# Patient Record
Sex: Female | Born: 1935 | Race: Black or African American | Hispanic: No | Smoking: Never smoker
Health system: Southern US, Community
[De-identification: ages and names within clinical notes are randomized; demographics above are authoritative.]

## PROBLEM LIST (undated history)

## (undated) DIAGNOSIS — I10 Essential (primary) hypertension: Secondary | ICD-10-CM

## (undated) DIAGNOSIS — E119 Type 2 diabetes mellitus without complications: Secondary | ICD-10-CM

## (undated) DIAGNOSIS — I499 Cardiac arrhythmia, unspecified: Secondary | ICD-10-CM

## (undated) DIAGNOSIS — Q615 Medullary cystic kidney: Secondary | ICD-10-CM

## (undated) DIAGNOSIS — M1990 Unspecified osteoarthritis, unspecified site: Secondary | ICD-10-CM

---

## 2016-01-03 ENCOUNTER — Emergency Department (HOSPITAL_BASED_OUTPATIENT_CLINIC_OR_DEPARTMENT_OTHER): Payer: Medicare Other

## 2016-01-03 ENCOUNTER — Encounter (HOSPITAL_BASED_OUTPATIENT_CLINIC_OR_DEPARTMENT_OTHER): Payer: Self-pay | Admitting: *Deleted

## 2016-01-03 ENCOUNTER — Emergency Department (HOSPITAL_BASED_OUTPATIENT_CLINIC_OR_DEPARTMENT_OTHER)
Admission: EM | Admit: 2016-01-03 | Discharge: 2016-01-03 | Disposition: A | Discharge status: Home / Self Care | Payer: Medicare Other | Attending: Emergency Medicine | Admitting: Emergency Medicine

## 2016-01-03 DIAGNOSIS — Z7984 Long term (current) use of oral hypoglycemic drugs: Secondary | ICD-10-CM | POA: Insufficient documentation

## 2016-01-03 DIAGNOSIS — Z79899 Other long term (current) drug therapy: Secondary | ICD-10-CM | POA: Diagnosis not present

## 2016-01-03 DIAGNOSIS — I10 Essential (primary) hypertension: Secondary | ICD-10-CM

## 2016-01-03 DIAGNOSIS — I1310 Hypertensive heart and chronic kidney disease without heart failure, with stage 1 through stage 4 chronic kidney disease, or unspecified chronic kidney disease: Secondary | ICD-10-CM | POA: Diagnosis not present

## 2016-01-03 DIAGNOSIS — N189 Chronic kidney disease, unspecified: Secondary | ICD-10-CM | POA: Insufficient documentation

## 2016-01-03 DIAGNOSIS — E1122 Type 2 diabetes mellitus with diabetic chronic kidney disease: Secondary | ICD-10-CM | POA: Insufficient documentation

## 2016-01-03 HISTORY — DX: Cardiac arrhythmia, unspecified: I49.9

## 2016-01-03 HISTORY — DX: Medullary cystic kidney: Q61.5

## 2016-01-03 HISTORY — DX: Essential (primary) hypertension: I10

## 2016-01-03 HISTORY — DX: Type 2 diabetes mellitus without complications: E11.9

## 2016-01-03 LAB — CBC WITH DIFFERENTIAL/PLATELET
BASOS ABS: 0 10*3/uL (ref 0.0–0.1)
BASOS PCT: 0 %
EOS PCT: 1 %
Eosinophils Absolute: 0.1 10*3/uL (ref 0.0–0.7)
HEMATOCRIT: 34.5 % — AB (ref 36.0–46.0)
Hemoglobin: 11.1 g/dL — ABNORMAL LOW (ref 12.0–15.0)
Lymphocytes Relative: 31 %
Lymphs Abs: 3 10*3/uL (ref 0.7–4.0)
MCH: 29.6 pg (ref 26.0–34.0)
MCHC: 32.2 g/dL (ref 30.0–36.0)
MCV: 92 fL (ref 78.0–100.0)
MONO ABS: 1.1 10*3/uL — AB (ref 0.1–1.0)
MONOS PCT: 12 %
NEUTROS ABS: 5.6 10*3/uL (ref 1.7–7.7)
Neutrophils Relative %: 56 %
PLATELETS: 192 10*3/uL (ref 150–400)
RBC: 3.75 MIL/uL — ABNORMAL LOW (ref 3.87–5.11)
RDW: 14.8 % (ref 11.5–15.5)
WBC: 9.8 10*3/uL (ref 4.0–10.5)

## 2016-01-03 LAB — URINE MICROSCOPIC-ADD ON

## 2016-01-03 LAB — BASIC METABOLIC PANEL
ANION GAP: 7 (ref 5–15)
BUN: 32 mg/dL — AB (ref 6–20)
CALCIUM: 8.8 mg/dL — AB (ref 8.9–10.3)
CO2: 26 mmol/L (ref 22–32)
CREATININE: 1.24 mg/dL — AB (ref 0.44–1.00)
Chloride: 104 mmol/L (ref 101–111)
GFR calc Af Amer: 46 mL/min — ABNORMAL LOW (ref >60)
GFR, EST NON AFRICAN AMERICAN: 40 mL/min — AB (ref >60)
GLUCOSE: 214 mg/dL — AB (ref 65–99)
Potassium: 4.1 mmol/L (ref 3.5–5.1)
Sodium: 137 mmol/L (ref 135–145)

## 2016-01-03 LAB — URINALYSIS, ROUTINE W REFLEX MICROSCOPIC
BILIRUBIN URINE: NEGATIVE
GLUCOSE, UA: 100 mg/dL — AB
Hgb urine dipstick: NEGATIVE
KETONES UR: NEGATIVE mg/dL
NITRITE: NEGATIVE
PH: 5.5 (ref 5.0–8.0)
PROTEIN: NEGATIVE mg/dL
Specific Gravity, Urine: 1.019 (ref 1.005–1.030)

## 2016-01-03 LAB — TROPONIN I: Troponin I: <0.03 ng/mL (ref <0.03)

## 2016-01-03 NOTE — ED Triage Notes (Signed)
Pt reports her b/p has been elevated for 3 days. Recently started on benadryl and prednisone for itching. Pt reports she has been taking her home medications

## 2016-01-03 NOTE — ED Provider Notes (Signed)
[facility] Note   CSN: [id number] Arrival date & time: 01/03/16  1829 By signing my name below, I, Levon Hedger, attest that this documentation has been prepared under the direction and in the presence of No att. providers found . Electronically Signed: Levon Hedger, Scribe. 01/03/2016. 7:38 PM.   History   Chief Complaint Chief Complaint  Patient presents with  . Hypertension   HPI Bryer Gottsch is a 82 y.o. female with hx of DM, rheumatoid arthritis, CKD stage 3,  and HTN who presents to the Emergency Department complaining of elevated blood pressure onset three days ago. Her blood pressure was running high at the doctor's office three days ago and then the next day at another appointment, her BP was in the 200's. Pt has not been consistent with taking her blood pressure medication, but she has been complaint with her medication for the last three days. Pt denies any pain, dizziness, abdominal pain, chest pain, nausea, vomiting, or SOB.   Pt also complains of two episodes of brief chest pain this week which have since resolved. The episodes occur while she is at rest and last for a few seconds. One year ago, pt was prescribed nitroglycerin for this which she has taken with relief. She notes associated lightheadedness and tingling in her hands.  Per pt, this feels like prior episodes of CP. She denies any SOB, nausea or diaphoresis. She denies any escalation of chest pain frequency. No CP with exertion.    Sh has no hx of MI or cardiac stents. She is followed by Dr. Judithe Modest for cardiology and Joetta Manners at [facility] for primary care. Pt is a nonsmoker.   The history is provided by the patient. No language interpreter was used.   Past Medical History:  Diagnosis Date  . Diabetes mellitus without complication (HCC)   . Hypertension   . Irregular heart beat   . Sponge kidney     There are no active problems to display for this patient.  History reviewed.  No pertinent surgical history.  OB History    No data available      Home Medications    Prior to Admission medications   Medication Sig Start Date End Date Taking? Authorizing Provider  ATORVASTATIN CALCIUM PO Take by mouth.   Yes Historical Provider, MD  CARVEDILOL PO Take by mouth.   Yes Historical Provider, MD  GABAPENTIN PO Take by mouth as needed.   Yes Historical Provider, MD  LISINOPRIL PO Take by mouth.   Yes Historical Provider, MD  METFORMIN HCL PO Take by mouth.   Yes Historical Provider, MD  methotrexate (RHEUMATREX) 2.5 MG tablet Take 2.5 mg by mouth once a week. Caution:Chemotherapy. Protect from light.   Yes Historical Provider, MD   Family History No family history on file.  Social History Social History  Substance Use Topics  . Smoking status: Never Smoker  . Smokeless tobacco: Never Used  . Alcohol use No    Allergies   Review of patient's allergies indicates no known allergies.  Review of Systems Review of Systems 10 systems reviewed and all are negative for acute change except as noted in the HPI.   Physical Exam Updated Vital Signs BP 175/83   Pulse 66   Temp 98 F (36.7 C) (Oral)   Resp 18   Ht 5\' 3"  (1.6 m)   Wt 139 lb (63 kg)   SpO2 97%   BMI 24.62 kg/m   Physical Exam  Constitutional:  She is oriented to person, place, and time. She appears well-developed and well-nourished. No distress.  HENT:  Head: Normocephalic and atraumatic.  Moist mucous membranes  Eyes: Conjunctivae are normal. Pupils are equal, round, and reactive to light.  Neck: Neck supple.  Cardiovascular: Normal rate, regular rhythm and normal heart sounds.   No murmur heard. Pulmonary/Chest: Effort normal and breath sounds normal.  Abdominal: Soft. Bowel sounds are normal. She exhibits no distension. There is no tenderness.  Musculoskeletal: She exhibits no edema.  Neurological: She is alert and oriented to person, place, and time.  Fluent speech  Skin: Skin is  warm and dry.  Psychiatric: She has a normal mood and affect. Judgment normal.  Nursing note and vitals reviewed.  ED Treatments / Results  DIAGNOSTIC STUDIES:  Oxygen Saturation is 100% on RA, normal by my interpretation.    COORDINATION OF CARE:  9:58 PM Discussed treatment plan with pt at bedside and pt agreed to plan.  Labs (all labs ordered are listed, but only abnormal results are displayed) Labs Reviewed  BASIC METABOLIC PANEL - Abnormal; Notable for the following:       Result Value   Glucose, Bld 214 (*)    BUN 32 (*)    Creatinine, Ser 1.24 (*)    Calcium 8.8 (*)    GFR calc non Af Amer 40 (*)    GFR calc Af Amer 46 (*)    All other components within normal limits  CBC WITH DIFFERENTIAL/PLATELET - Abnormal; Notable for the following:    RBC 3.75 (*)    Hemoglobin 11.1 (*)    HCT 34.5 (*)    Monocytes Absolute 1.1 (*)    All other components within normal limits  URINALYSIS, ROUTINE W REFLEX MICROSCOPIC (NOT AT [facility]) - Abnormal; Notable for the following:    Glucose, UA 100 (*)    Leukocytes, UA MODERATE (*)    All other components within normal limits  URINE MICROSCOPIC-ADD ON - Abnormal; Notable for the following:    Squamous Epithelial / LPF 0-5 (*)    Bacteria, UA RARE (*)    All other components within normal limits  URINE CULTURE  TROPONIN I    EKG  EKG Interpretation  Date/Time:  Friday January 03 2016 19:33:56 EDT Ventricular Rate:  64 PR Interval:    QRS Duration: 145 QT Interval:  431 QTC Calculation: 445 R Axis:   -64 Text Interpretation:  Sinus rhythm Atrial premature complexes Consider left atrial enlargement RBBB and LAFB No previous ECGs available Confirmed by Kellyjo Edgren MD, Brieann Osinski [phone] on 01/03/2016 8:18:19 PM       Radiology Dg Chest 2 View  Result Date: 01/03/2016 CLINICAL DATA:  Chest pain a few days ago EXAM: CHEST  2 VIEW COMPARISON:  None. FINDINGS: The heart size and mediastinal contours are within normal limits. There is  aortic atherosclerosis. There is minimal bibasilar atelectasis. There may be a trace left pleural effusion minimal blunting the costophrenic and posterior costophrenic angles. The visualized skeletal structures are unremarkable. IMPRESSION: No active cardiopulmonary disease. Possible trace left pleural effusion. Bibasilar atelectasis. Electronically Signed   By: Tollie Ethavid  Kwon M.D.   On: 01/03/2016 20:20    Procedures Procedures (including critical care time)  Medications Ordered in ED Medications - No data to display   Initial Impression / Assessment and Plan / ED Course  I have reviewed the triage vital signs and the nursing notes.  Pertinent labs & imaging results that were available during my care  of the patient were reviewed by me and considered in my medical decision making (see chart for details).  Clinical Course   Pt Presents with several days of hypertension. She admits to being noncompliant with her medications but did start taking her medicines again recently. She was well-appearing on exam with no complaints. Vital signs notable for hypertension, initially near 200 systolic, later 175/83. During the history the patient mentions to episodes of chest pain lasting a few seconds each, last episode was 2 days ago and she has had none since then. She also notes that she has had these episodes previously and they do not appear to be happening more frequently as of late. Her lab work here shows creatinine 1.2 for which I suspect is close to baseline given her report of chronic kidney disease. I explained that given the brief nature of her episodes and the fact that she has had similar episodes previously, I do not feel she necessarily needs admission for chest pain rule out today as she does follow with a cardiologist and states that she will contact him for a close follow-up appointment. She understands need to return if any further chest pain episodes or new symptoms. I have emphasized the  importance of strict compliance with her blood pressure medications and follow-up with PCP so that she can have appropriate medication adjustments if needed. Patient and daughter voiced understanding of this plan and felt comfortable with discharge. Patient discharged in satisfactory condition.  Final Clinical Impressions(s) / ED Diagnoses   Final diagnoses:  Essential hypertension  Chronic kidney disease, unspecified CKD stage   New Prescriptions Discharge Medication List as of 01/03/2016  9:28 PM    I personally performed the services described in this documentation, which was scribed in my presence. The recorded information has been reviewed and is accurate.    Laurence Spates, MD 01/03/16 2203

## 2016-01-03 NOTE — ED Notes (Signed)
MD is assessing pt at this time and we are not able to access pt to get the EKG done.

## 2016-01-03 NOTE — ED Notes (Signed)
MD at bedside. 

## 2016-01-03 NOTE — ED Notes (Signed)
Patient transported to X-ray 

## 2016-01-05 LAB — URINE CULTURE
CULTURE: NO GROWTH
SPECIAL REQUESTS: NORMAL

## 2016-09-09 ENCOUNTER — Emergency Department (HOSPITAL_BASED_OUTPATIENT_CLINIC_OR_DEPARTMENT_OTHER): Payer: Medicare Other

## 2016-09-09 ENCOUNTER — Encounter (HOSPITAL_BASED_OUTPATIENT_CLINIC_OR_DEPARTMENT_OTHER): Payer: Self-pay | Admitting: *Deleted

## 2016-09-09 ENCOUNTER — Emergency Department (HOSPITAL_BASED_OUTPATIENT_CLINIC_OR_DEPARTMENT_OTHER)
Admission: EM | Admit: 2016-09-09 | Discharge: 2016-09-09 | Disposition: A | Discharge status: Home / Self Care | Payer: Medicare Other | Attending: Emergency Medicine | Admitting: Emergency Medicine

## 2016-09-09 DIAGNOSIS — I10 Essential (primary) hypertension: Secondary | ICD-10-CM | POA: Insufficient documentation

## 2016-09-09 DIAGNOSIS — E119 Type 2 diabetes mellitus without complications: Secondary | ICD-10-CM | POA: Insufficient documentation

## 2016-09-09 DIAGNOSIS — Z7984 Long term (current) use of oral hypoglycemic drugs: Secondary | ICD-10-CM | POA: Diagnosis not present

## 2016-09-09 DIAGNOSIS — J189 Pneumonia, unspecified organism: Secondary | ICD-10-CM | POA: Insufficient documentation

## 2016-09-09 DIAGNOSIS — R42 Dizziness and giddiness: Secondary | ICD-10-CM | POA: Diagnosis present

## 2016-09-09 HISTORY — DX: Unspecified osteoarthritis, unspecified site: M19.90

## 2016-09-09 LAB — URINALYSIS, ROUTINE W REFLEX MICROSCOPIC
BILIRUBIN URINE: NEGATIVE
GLUCOSE, UA: NEGATIVE mg/dL
HGB URINE DIPSTICK: NEGATIVE
Ketones, ur: NEGATIVE mg/dL
Leukocytes, UA: NEGATIVE
Nitrite: NEGATIVE
PH: 7.5 (ref 5.0–8.0)
Protein, ur: NEGATIVE mg/dL
SPECIFIC GRAVITY, URINE: 1.007 (ref 1.005–1.030)

## 2016-09-09 LAB — COMPREHENSIVE METABOLIC PANEL
ALBUMIN: 3.9 g/dL (ref 3.5–5.0)
ALK PHOS: 79 U/L (ref 38–126)
ALT: 14 U/L (ref 14–54)
ANION GAP: 8 (ref 5–15)
AST: 21 U/L (ref 15–41)
BILIRUBIN TOTAL: 0.5 mg/dL (ref 0.3–1.2)
BUN: 18 mg/dL (ref 6–20)
CALCIUM: 9.4 mg/dL (ref 8.9–10.3)
CO2: 27 mmol/L (ref 22–32)
Chloride: 102 mmol/L (ref 101–111)
Creatinine, Ser: 0.94 mg/dL (ref 0.44–1.00)
GFR calc Af Amer: >60 mL/min (ref >60)
GFR calc non Af Amer: 55 mL/min — ABNORMAL LOW (ref >60)
GLUCOSE: 139 mg/dL — AB (ref 65–99)
Potassium: 3.9 mmol/L (ref 3.5–5.1)
SODIUM: 137 mmol/L (ref 135–145)
Total Protein: 7.8 g/dL (ref 6.5–8.1)

## 2016-09-09 LAB — CBC WITH DIFFERENTIAL/PLATELET
Basophils Absolute: 0 10*3/uL (ref 0.0–0.1)
Basophils Relative: 0 %
Eosinophils Absolute: 0.1 10*3/uL (ref 0.0–0.7)
Eosinophils Relative: 2 %
HEMATOCRIT: 34.2 % — AB (ref 36.0–46.0)
Hemoglobin: 11.1 g/dL — ABNORMAL LOW (ref 12.0–15.0)
LYMPHS ABS: 1.2 10*3/uL (ref 0.7–4.0)
Lymphocytes Relative: 21 %
MCH: 28.5 pg (ref 26.0–34.0)
MCHC: 32.5 g/dL (ref 30.0–36.0)
MCV: 87.9 fL (ref 78.0–100.0)
MONO ABS: 0.9 10*3/uL (ref 0.1–1.0)
MONOS PCT: 16 %
NEUTROS ABS: 3.5 10*3/uL (ref 1.7–7.7)
NEUTROS PCT: 61 %
Platelets: 231 10*3/uL (ref 150–400)
RBC: 3.89 MIL/uL (ref 3.87–5.11)
RDW: 15.1 % (ref 11.5–15.5)
WBC: 5.7 10*3/uL (ref 4.0–10.5)

## 2016-09-09 LAB — TROPONIN I

## 2016-09-09 LAB — CBG MONITORING, ED: Glucose-Capillary: 125 mg/dL — ABNORMAL HIGH (ref 65–99)

## 2016-09-09 LAB — LIPASE, BLOOD: Lipase: 25 U/L (ref 11–51)

## 2016-09-09 MED ORDER — LEVOFLOXACIN 750 MG PO TABS: 750.0000 mg | ORAL_TABLET | Freq: Every day | ORAL | 1 refills | Status: AC

## 2016-09-09 MED ORDER — SODIUM CHLORIDE 0.9 % IV BOLUS (SEPSIS)
500.0000 mL | Freq: Once | INTRAVENOUS | Status: AC
  Administered 2016-09-09: 500 mL via INTRAVENOUS

## 2016-09-09 MED ORDER — LEVOFLOXACIN 750 MG PO TABS
750.0000 mg | ORAL_TABLET | Freq: Every day | ORAL | Status: DC
  Administered 2016-09-09: 750 mg via ORAL
  Filled 2016-09-09: qty 1

## 2016-09-09 MED ORDER — SODIUM CHLORIDE 0.9 % IV SOLN
INTRAVENOUS | Status: DC
  Administered 2016-09-09: 21:00:00 via INTRAVENOUS

## 2016-09-09 NOTE — Discharge Instructions (Signed)
Chest x-ray raises some concern for possible early pneumonia behind the heart. Take the antibiotic as directed. Make an appointment to follow-up with your doctor. Would expect some improvement in 2 days. Return for any new or worse symptoms.

## 2016-09-09 NOTE — ED Notes (Signed)
ED Provider at bedside. 

## 2016-09-09 NOTE — ED Provider Notes (Signed)
[facility]   CSN: [id number] Arrival date & time: 09/09/16  1851  By signing my name below, I, Linna Darner, attest that this documentation has been prepared under the direction and in the presence of physician practitioner, Vanetta Mulders, MD. Electronically Signed: Linna Darner, Scribe. 09/09/2016. 8:35 PM.  History   Chief Complaint Chief Complaint  Patient presents with  . Dizziness   The history is provided by the patient and a relative. No language interpreter was used.  Dizziness  Quality:  Head spinning Severity:  Moderate Onset quality:  Sudden Duration:  6 hours Timing: persistent. Progression:  Unchanged Chronicity:  New Relieved by:  Nothing Worsened by:  Nothing Ineffective treatments:  None tried Associated symptoms: nausea and palpitations   Associated symptoms: no chest pain, no diarrhea, no headaches, no shortness of breath and no vomiting   Risk factors: multiple medications     HPI Comments: Julie Strickland is a 83 y.o. female brought in by her daughter who presents to the Emergency Department complaining of persistent dizziness beginning around 2 PM this afternoon. She states her dizziness is not a room-spinning sensation. Patient reports some associated nausea without vomiting. Patient had some epigastric pain when her dizziness initially presented but notes this has now resolved. She is concerned that her dizziness may be related to either her blood glucose or blood pressure and she has been compliant with her DM and HTN medications. Patient felt normal yesterday. She has no prior h/o the same. Patient reports some baseline palpitations as well as bilateral ankle pain related to rheumatoid arthritis without acute changes. She denies fevers, chills, vision changes, cough, rhinorrhea, sore throat, dyspnea, chest pain, diarrhea, back pain, dysuria, hematuria, headaches, rashes, or any other associated symptoms.  Past Medical History:   Diagnosis Date  . Arthritis   . Diabetes mellitus without complication (HCC)   . Hypertension   . Irregular heart beat   . Sponge kidney     There are no active problems to display for this patient.   History reviewed. No pertinent surgical history.  OB History    No data available       Home Medications    Prior to Admission medications   Medication Sig Start Date End Date Taking? Authorizing Provider  ATORVASTATIN CALCIUM PO Take by mouth.   Yes [provider]  CARVEDILOL PO Take by mouth.   Yes [provider]  GABAPENTIN PO Take by mouth as needed.   Yes [provider]  GLIMEPIRIDE PO Take by mouth.   Yes [provider]  LISINOPRIL PO Take by mouth.   Yes [provider]  methotrexate (RHEUMATREX) 2.5 MG tablet Take 2.5 mg by mouth once a week. Caution:Chemotherapy. Protect from light.   Yes [provider]  levofloxacin (LEVAQUIN) 750 MG tablet Take 1 tablet (750 mg total) by mouth daily. 09/09/16   Vanetta Mulders, MD  METFORMIN HCL PO Take by mouth.    [provider]    Family History No family history on file.  Social History Social History  Substance Use Topics  . Smoking status: Never Smoker  . Smokeless tobacco: Never Used  . Alcohol use No     Allergies   Patient has no known allergies.   Review of Systems Review of Systems  Constitutional: Negative for chills and fever.  HENT: Negative for rhinorrhea and sore throat.   Eyes: Negative for visual disturbance.  Respiratory: Negative for cough and shortness of breath.  Cardiovascular: Positive for palpitations. Negative for chest pain and leg swelling.  Gastrointestinal: Positive for abdominal pain (resolved) and nausea. Negative for diarrhea and vomiting.  Genitourinary: Negative for dysuria and hematuria.  Musculoskeletal: Positive for arthralgias. Negative for back pain.  Skin: Negative for rash.  Neurological: Positive for  dizziness. Negative for headaches.  Hematological: Does not bruise/bleed easily.  Psychiatric/Behavioral: Negative for confusion.   Physical Exam Updated Vital Signs BP (!) 168/93   Pulse 70   Temp 98 F (36.7 C) (Oral)   Resp 14   Ht 1.651 m (5\' 5" )   Wt 63 kg (139 lb)   SpO2 99%   BMI 23.13 kg/m   Physical Exam  Constitutional: She is oriented to person, place, and time. She appears well-developed and well-nourished.  HENT:  Head: Normocephalic.  Mouth/Throat: Oropharynx is clear and moist.  Eyes: Conjunctivae and EOM are normal. Pupils are equal, round, and reactive to light. Right eye exhibits no discharge. Left eye exhibits no discharge. No scleral icterus.  Cardiovascular: Normal rate and normal heart sounds.  An irregular rhythm present.  No murmur heard. Pulmonary/Chest: Effort normal and breath sounds normal. No respiratory distress. She has no wheezes. She has no rales.  Abdominal: Soft. Bowel sounds are normal. She exhibits no distension. There is no tenderness.  Musculoskeletal: She exhibits no edema.  Arthritic changes in the hands. No pitting edema.  Neurological: She is alert and oriented to person, place, and time. No cranial nerve deficit. She exhibits normal muscle tone. Coordination normal.  Skin: Skin is warm and dry.  Psychiatric: She has a normal mood and affect.  Nursing note and vitals reviewed.  ED Treatments / Results  Labs (all labs ordered are listed, but only abnormal results are displayed) Labs Reviewed  COMPREHENSIVE METABOLIC PANEL - Abnormal; Notable for the following:       Result Value   Glucose, Bld 139 (*)    GFR calc non Af Amer 55 (*)    All other components within normal limits  CBC WITH DIFFERENTIAL/PLATELET - Abnormal; Notable for the following:    Hemoglobin 11.1 (*)    HCT 34.2 (*)    All other components within normal limits  CBG MONITORING, ED - Abnormal; Notable for the following:    Glucose-Capillary 125 (*)    All other  components within normal limits  LIPASE, BLOOD  URINALYSIS, ROUTINE W REFLEX MICROSCOPIC  TROPONIN I    EKG  EKG Interpretation  Date/Time:  Wednesday September 09 2016 19:14:06 EDT Ventricular Rate:  75 PR Interval:  162 QRS Duration: 150 QT Interval:  434 QTC Calculation: 484 R Axis:   104 Text Interpretation:  Sinus rhythm with frequent Premature ventricular complexes Right bundle branch block Abnormal ECG Confirmed by Vanetta MuldersZackowski, Seif Teichert [phone] on 09/09/2016 8:19:37 PM       Radiology Dg Chest 2 View  Result Date: 09/09/2016 CLINICAL DATA:  Acute onset of dizziness and nausea. Initial encounter. EXAM: CHEST  2 VIEW COMPARISON:  Chest radiograph performed 01/03/2016 FINDINGS: The lungs are well-aerated. Mild retrocardiac opacity may reflect atelectasis or possibly mild pneumonia. There is no evidence of pleural effusion or pneumothorax. The heart is borderline enlarged. No acute osseous abnormalities are seen. IMPRESSION: Mild retrocardiac opacity may reflect atelectasis or possibly mild pneumonia. Borderline cardiomegaly. Electronically Signed   By: Roanna RaiderJeffery  Chang M.D.   On: 09/09/2016 21:38   Ct Head Wo Contrast  Result Date: 09/09/2016 CLINICAL DATA:  Nausea and dizziness today. EXAM: CT HEAD WITHOUT  CONTRAST TECHNIQUE: Contiguous axial images were obtained from the base of the skull through the vertex without intravenous contrast. COMPARISON:  05/12/2012. FINDINGS: Brain: There is no evidence for acute hemorrhage, hydrocephalus, mass lesion, or abnormal extra-axial fluid collection. No definite CT evidence for acute infarction. Diffuse loss of parenchymal volume is consistent with atrophy. Patchy low attenuation in the deep hemispheric and periventricular white matter is nonspecific, but likely reflects chronic microvascular ischemic demyelination. Lacunar infarct noted left brainstem, present since prior study. Vascular: No hyperdense vessel or unexpected calcification. Skull: No evidence  for fracture. No worrisome lytic or sclerotic lesion. Sinuses/Orbits: The visualized paranasal sinuses and mastoid air cells are clear. Visualized portions of the globes and intraorbital fat are unremarkable. Other: Choose 1 IMPRESSION: 1. No acute intracranial abnormality. 2. Atrophy with chronic small vessel white matter ischemic disease. Electronically Signed   By: Kennith Center M.D.   On: 09/09/2016 21:29    Procedures Procedures (including critical care time)  DIAGNOSTIC STUDIES: Oxygen Saturation is 100% on RA, normal by my interpretation.    COORDINATION OF CARE: 8:34 PM Discussed treatment plan with pt at bedside and pt agreed to plan.  Medications Ordered in ED Medications  0.9 %  sodium chloride infusion ( Intravenous New Bag/Given 09/09/16 2049)  levofloxacin (LEVAQUIN) tablet 750 mg (not administered)  sodium chloride 0.9 % bolus 500 mL (0 mLs Intravenous Stopped 09/09/16 2125)     Initial Impression / Assessment and Plan / ED Course  I have reviewed the triage vital signs and the nursing notes.  Pertinent labs & imaging results that were available during my care of the patient were reviewed by me and considered in my medical decision making (see chart for details).     The patient feels better overall. Workup without any acute findings other than some concern for retrocardiac may be early pneumonia. Based on patient's symptoms possibilities him in a fever though and no leukocytosis. However will give a course of Levaquin and have her follow-up with her primary care doctor. Patient historically sounds as if this would be a community-acquired pneumonia but she's not roll accurate with her fax and family members are now gone. So we'll treat with Levaquin which will cover for both.  Final Clinical Impressions(s) / ED Diagnoses   Final diagnoses:  Community acquired pneumonia, unspecified laterality    New Prescriptions New Prescriptions   LEVOFLOXACIN (LEVAQUIN) 750 MG  TABLET    Take 1 tablet (750 mg total) by mouth daily.   I personally performed the services described in this documentation, which was scribed in my presence. The recorded information has been reviewed and is accurate.      Vanetta Mulders, MD 09/09/16 [phone]

## 2016-09-09 NOTE — ED Triage Notes (Signed)
Nausea and dizziness this afternoon. Abdominal pain that comes and goes.

## 2016-09-09 NOTE — ED Notes (Signed)
Urine specimen collected and sent to lab pending an order

## 2017-09-23 IMAGING — CT CT HEAD W/O CM
3 series · 15 of 47 positions shown, 18 images · non-contrast
Comparison: 05/12/2012.

CLINICAL DATA: Nausea and dizziness today.

EXAM:
CT HEAD WITHOUT CONTRAST
TECHNIQUE: Contiguous axial images were obtained from the base of the skull
through the vertex without intravenous contrast.

[Series 2: head wo · axial · 0.41mm/px · z∈[+1056,+1186]mm · 9 of 32 slices shown, 12 images]
[im 3/32  brain]
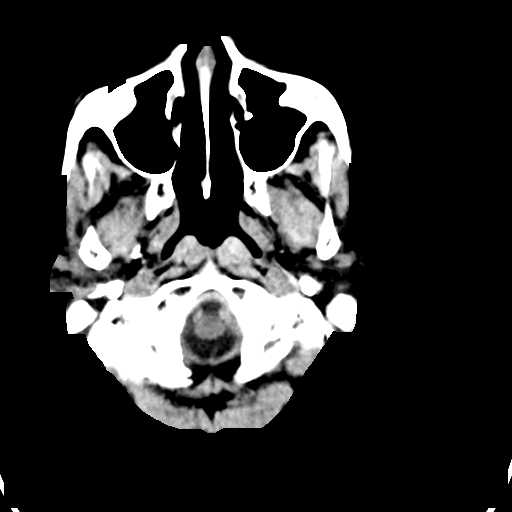
[im 3/32  bone]
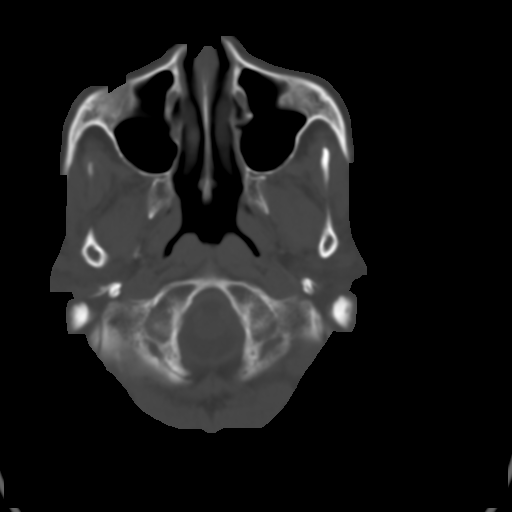
[im 6/32  brain]
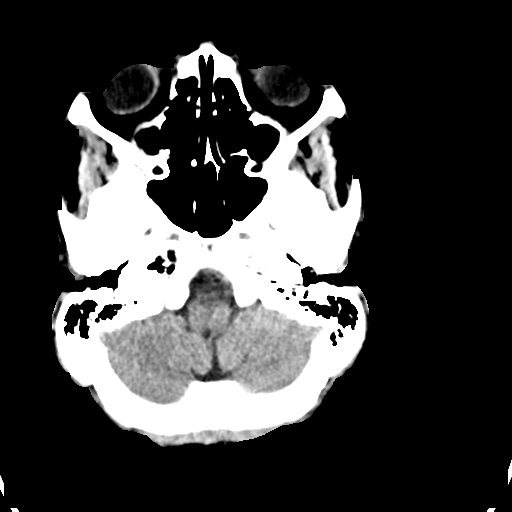
[im 9/32  brain]
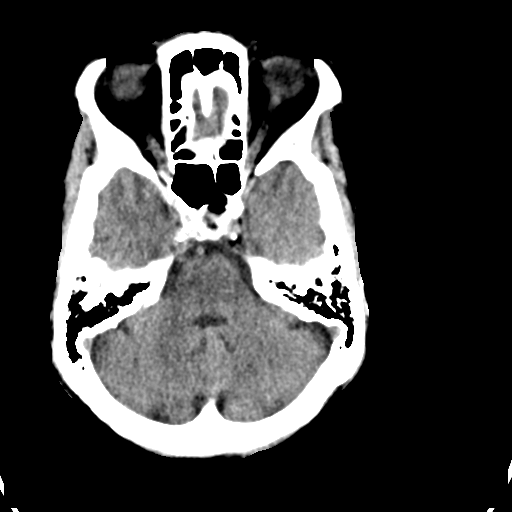
[im 12/32  brain]
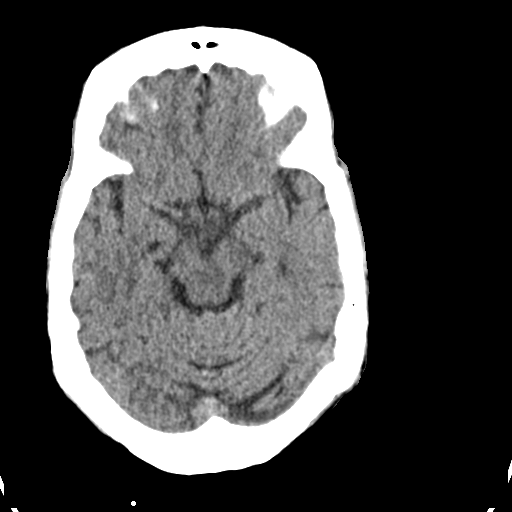
[im 17/32  brain]
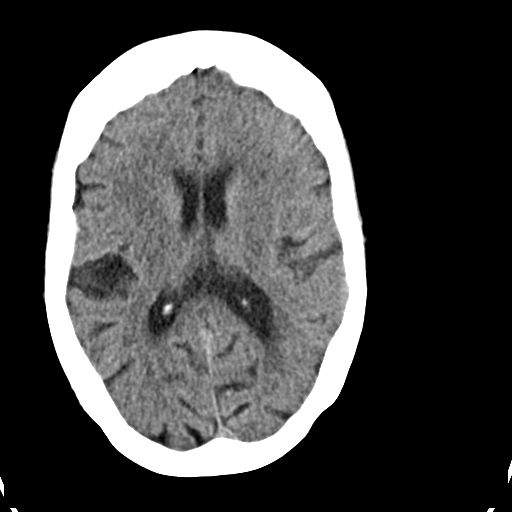
[im 17/32  bone]
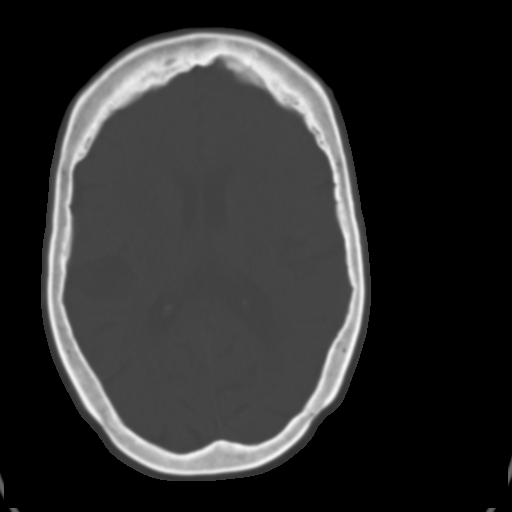
[im 20/32  brain]
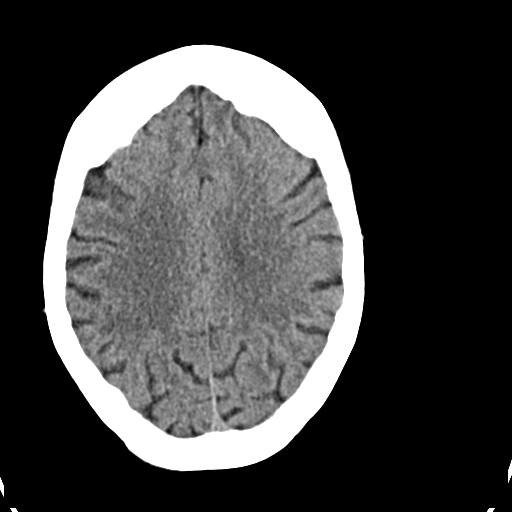
[im 23/32  brain]
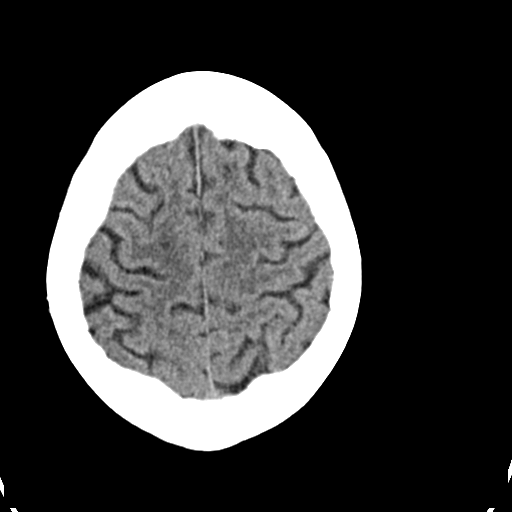
[im 26/32  brain]
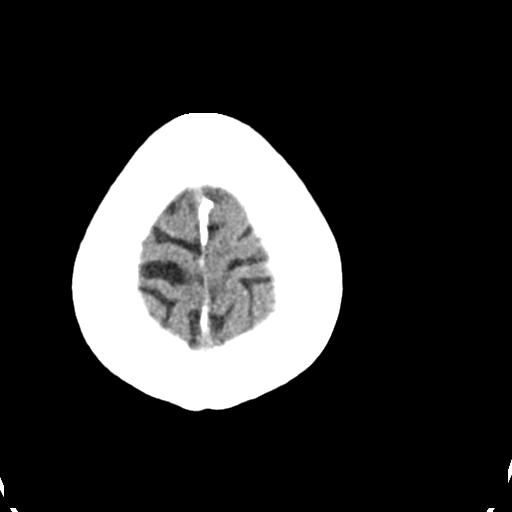
[im 29/32  brain]
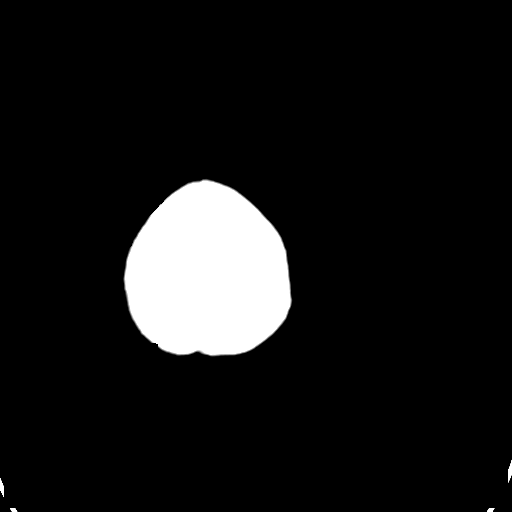
[im 29/32  bone]
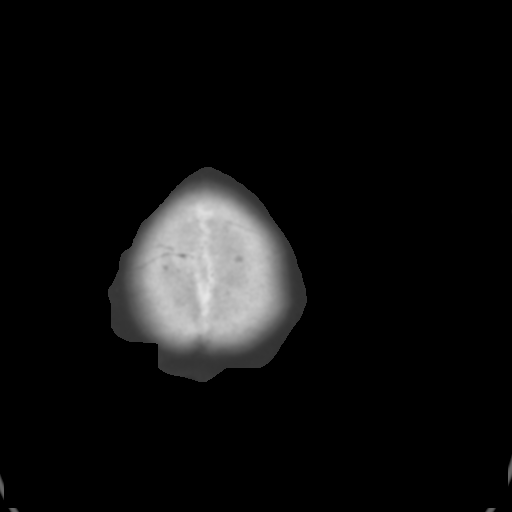

[Series 4: coronal soft · coronal · 0.31mm/px · 3 of 67 slices shown]
[im 23/67  brain]
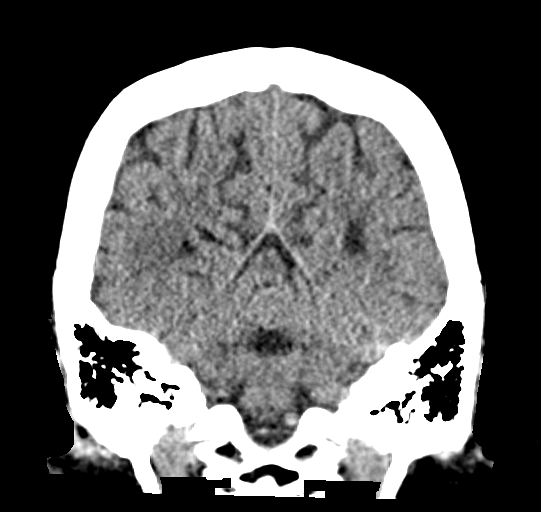
[im 30/67  brain]
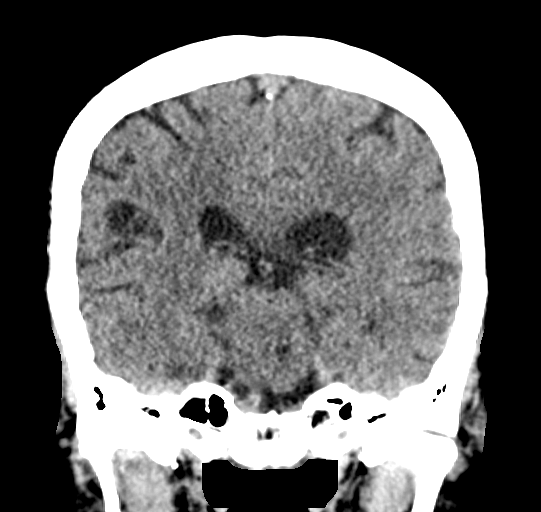
[im 37/67  brain]
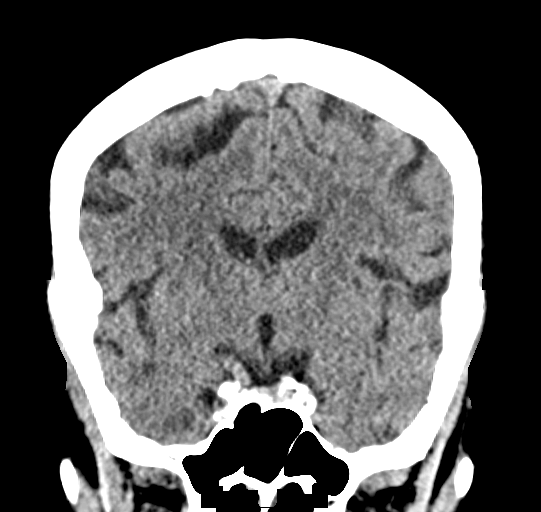

[Series 5: sag soft · sagittal · 0.31mm/px · 3 of 56 slices shown]
[im 19/56  brain]
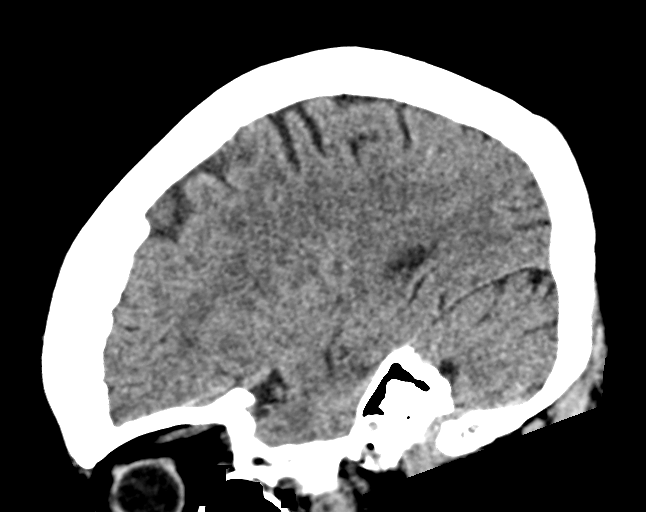
[im 28/56  brain]
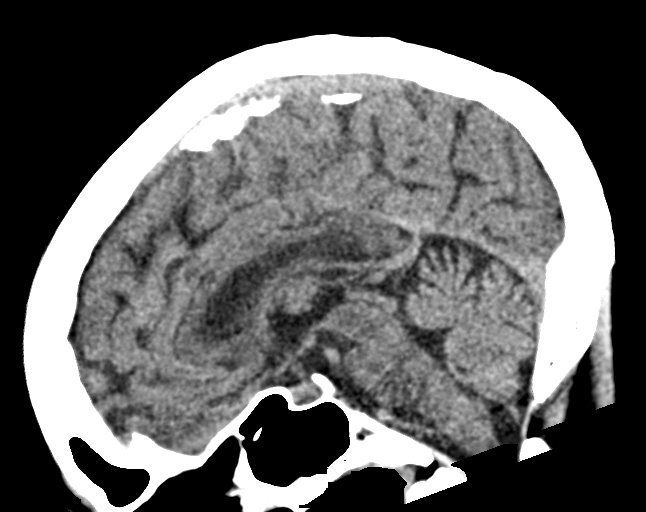
[im 37/56  brain]
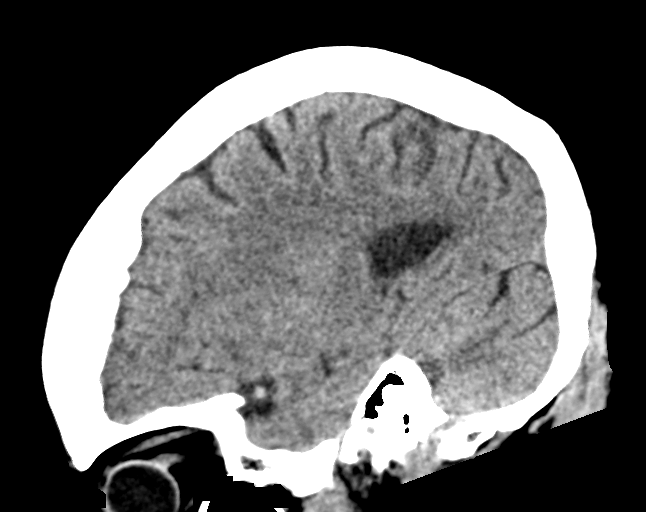

[15 of 47 positions shown; findings below may reference images not displayed]

FINDINGS: Brain: There is no evidence for acute hemorrhage, hydrocephalus,
mass lesion, or abnormal extra-axial fluid collection. No definite
CT evidence for acute infarction. Diffuse loss of parenchymal volume
is consistent with atrophy. Patchy low attenuation in the deep
hemispheric and periventricular white matter is nonspecific, but
likely reflects chronic microvascular ischemic demyelination.
Lacunar infarct noted left brainstem, present since prior study.

Vascular: No hyperdense vessel or unexpected calcification.

Skull: No evidence for fracture. No worrisome lytic or sclerotic
lesion.

Sinuses/Orbits: The visualized paranasal sinuses and mastoid air
cells are clear. Visualized portions of the globes and intraorbital
fat are unremarkable.

Other: Choose 1
IMPRESSION: 1. No acute intracranial abnormality.
2. Atrophy with chronic small vessel white matter ischemic disease.

## 2017-09-23 IMAGING — DX DG CHEST 2V
2 series · 2 of 2 positions shown · non-contrast
Comparison: Chest radiograph performed 01/03/2016

CLINICAL DATA: Acute onset of dizziness and nausea. Initial
encounter.

EXAM:
CHEST  2 VIEW

[chest lat]
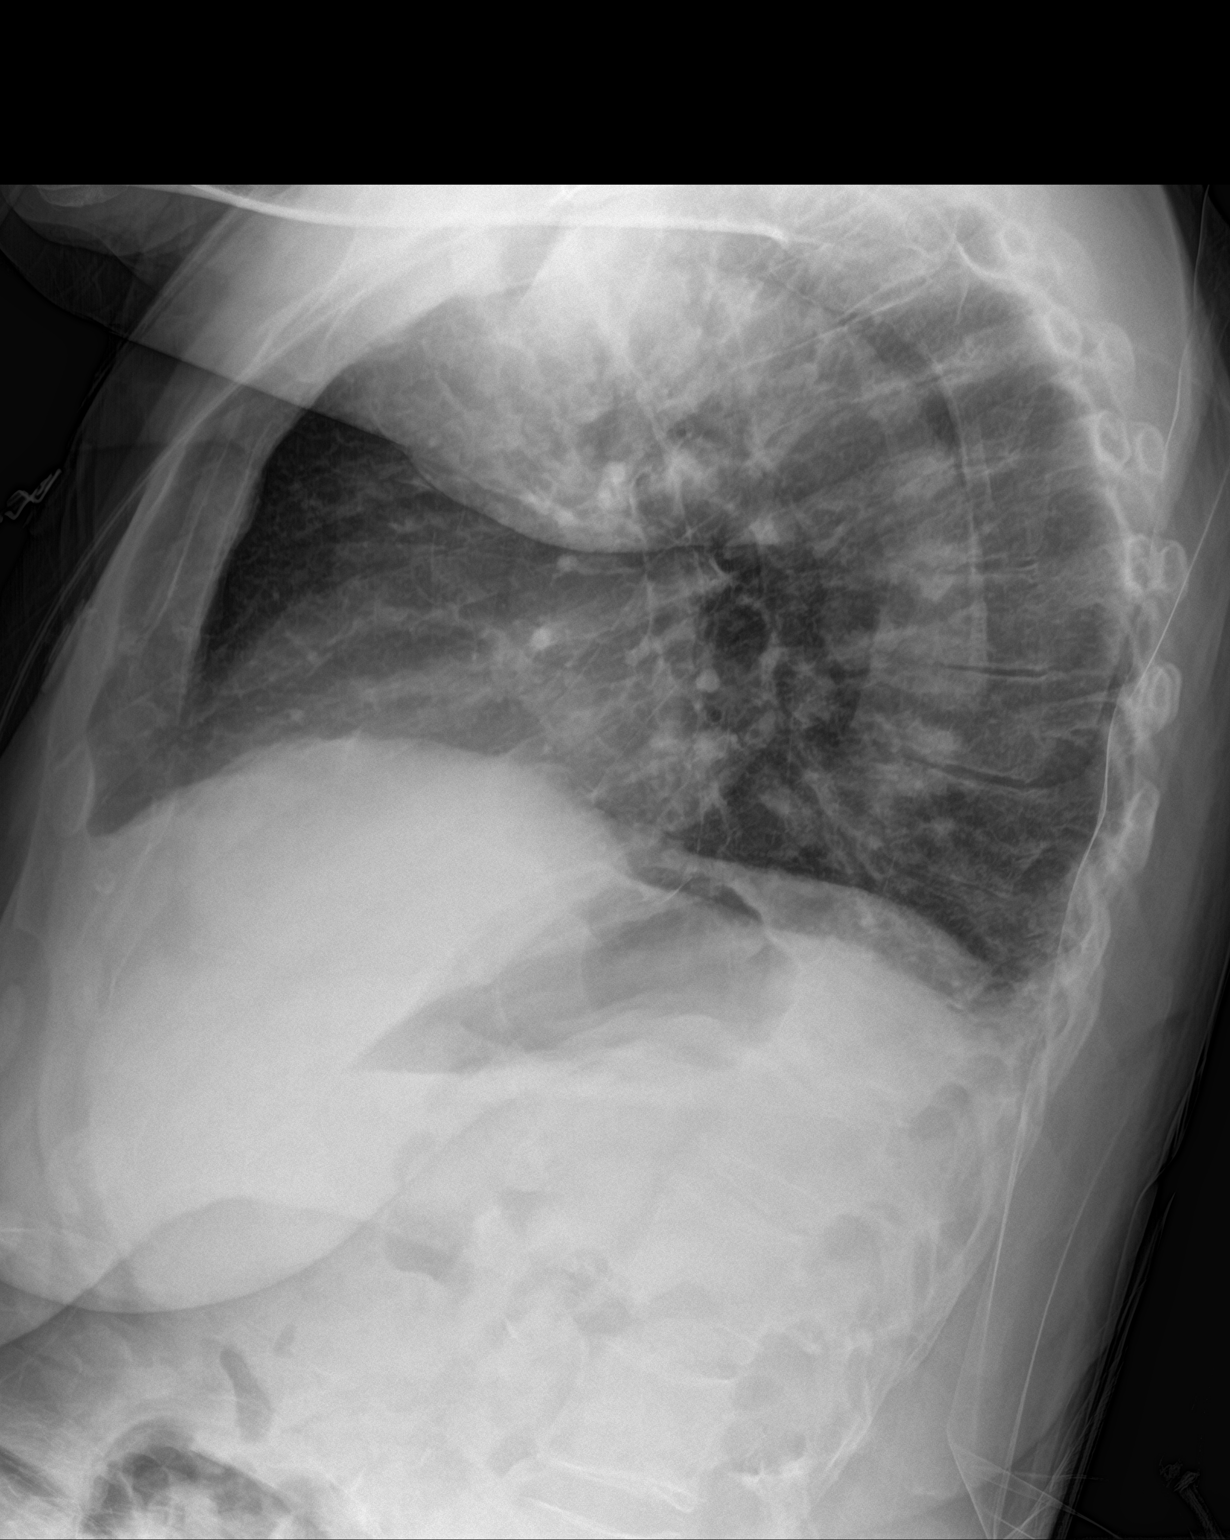

[chest ap strecther]
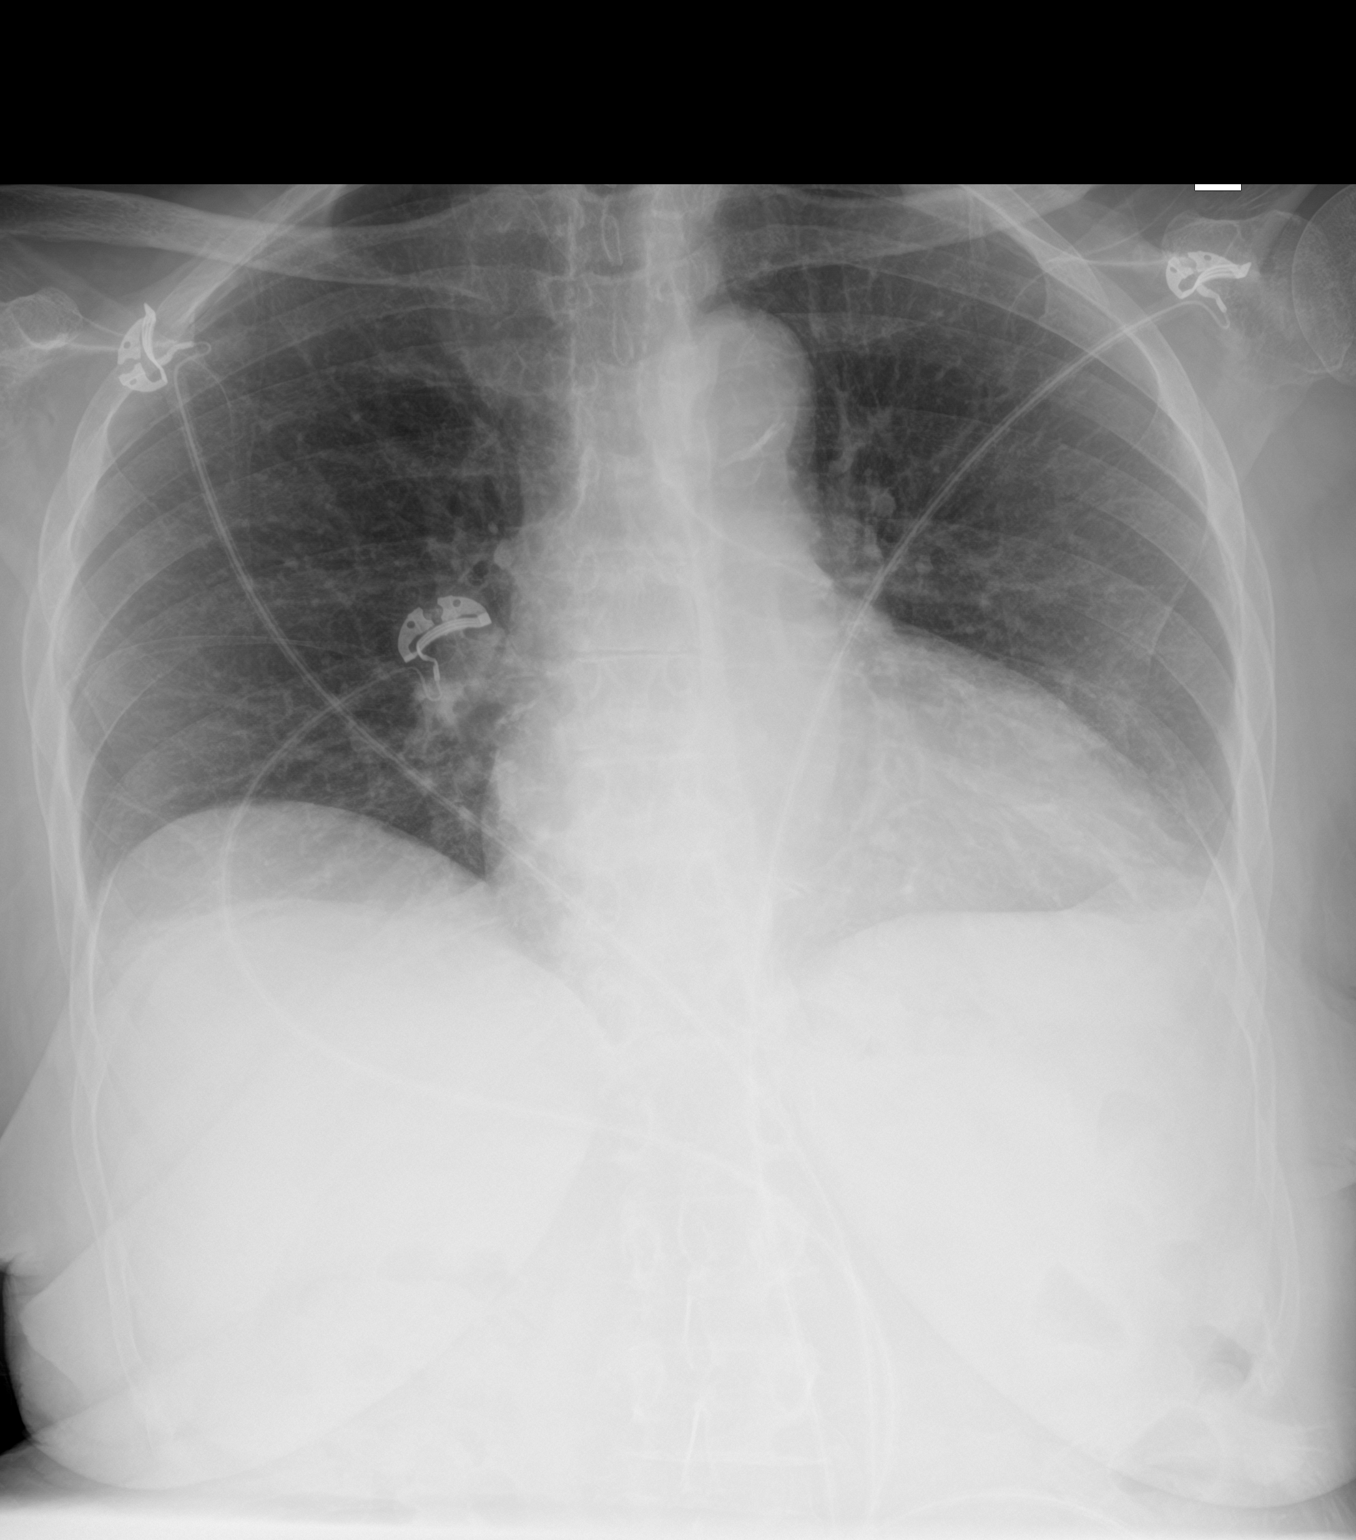

[2 of 2 positions shown; findings below may reference images not displayed]

FINDINGS: The lungs are well-aerated. Mild retrocardiac opacity may reflect
atelectasis or possibly mild pneumonia. There is no evidence of
pleural effusion or pneumothorax.

The heart is borderline enlarged. No acute osseous abnormalities are
seen.
IMPRESSION: Mild retrocardiac opacity may reflect atelectasis or possibly mild
pneumonia. Borderline cardiomegaly.

## 2020-02-29 ENCOUNTER — Encounter (HOSPITAL_BASED_OUTPATIENT_CLINIC_OR_DEPARTMENT_OTHER): Payer: Self-pay | Admitting: Emergency Medicine

## 2020-02-29 ENCOUNTER — Other Ambulatory Visit: Payer: Self-pay

## 2020-02-29 ENCOUNTER — Emergency Department (HOSPITAL_BASED_OUTPATIENT_CLINIC_OR_DEPARTMENT_OTHER)
Admission: EM | Admit: 2020-02-29 | Discharge: 2020-02-29 | Disposition: A | Discharge status: Home / Self Care | Payer: Medicare PPO | Attending: Emergency Medicine | Admitting: Emergency Medicine

## 2020-02-29 ENCOUNTER — Emergency Department (HOSPITAL_BASED_OUTPATIENT_CLINIC_OR_DEPARTMENT_OTHER): Payer: Medicare PPO

## 2020-02-29 DIAGNOSIS — R531 Weakness: Secondary | ICD-10-CM | POA: Diagnosis present

## 2020-02-29 DIAGNOSIS — I10 Essential (primary) hypertension: Secondary | ICD-10-CM | POA: Diagnosis not present

## 2020-02-29 DIAGNOSIS — Z7984 Long term (current) use of oral hypoglycemic drugs: Secondary | ICD-10-CM | POA: Insufficient documentation

## 2020-02-29 DIAGNOSIS — E119 Type 2 diabetes mellitus without complications: Secondary | ICD-10-CM | POA: Insufficient documentation

## 2020-02-29 DIAGNOSIS — Z79899 Other long term (current) drug therapy: Secondary | ICD-10-CM | POA: Diagnosis not present

## 2020-02-29 DIAGNOSIS — U071 COVID-19: Secondary | ICD-10-CM | POA: Insufficient documentation

## 2020-02-29 DIAGNOSIS — N3001 Acute cystitis with hematuria: Secondary | ICD-10-CM | POA: Diagnosis not present

## 2020-02-29 LAB — CBC WITH DIFFERENTIAL/PLATELET
Abs Immature Granulocytes: 0.04 10*3/uL (ref 0.00–0.07)
Basophils Absolute: 0 10*3/uL (ref 0.0–0.1)
Basophils Relative: 0 %
Eosinophils Absolute: 0 10*3/uL (ref 0.0–0.5)
Eosinophils Relative: 0 %
HCT: 32.1 % — ABNORMAL LOW (ref 36.0–46.0)
Hemoglobin: 10.6 g/dL — ABNORMAL LOW (ref 12.0–15.0)
Immature Granulocytes: 0 %
Lymphocytes Relative: 7 %
Lymphs Abs: 0.7 10*3/uL (ref 0.7–4.0)
MCH: 27.7 pg (ref 26.0–34.0)
MCHC: 33 g/dL (ref 30.0–36.0)
MCV: 83.8 fL (ref 80.0–100.0)
Monocytes Absolute: 1.1 10*3/uL — ABNORMAL HIGH (ref 0.1–1.0)
Monocytes Relative: 11 %
Neutro Abs: 8.1 10*3/uL — ABNORMAL HIGH (ref 1.7–7.7)
Neutrophils Relative %: 82 %
Platelets: 210 10*3/uL (ref 150–400)
RBC: 3.83 MIL/uL — ABNORMAL LOW (ref 3.87–5.11)
RDW: 14.5 % (ref 11.5–15.5)
WBC: 10 10*3/uL (ref 4.0–10.5)
nRBC: 0 % (ref 0.0–0.2)

## 2020-02-29 LAB — COMPREHENSIVE METABOLIC PANEL
ALT: 14 U/L (ref 0–44)
AST: 24 U/L (ref 15–41)
Albumin: 3.2 g/dL — ABNORMAL LOW (ref 3.5–5.0)
Alkaline Phosphatase: 57 U/L (ref 38–126)
Anion gap: 11 (ref 5–15)
BUN: 20 mg/dL (ref 8–23)
CO2: 23 mmol/L (ref 22–32)
Calcium: 8.3 mg/dL — ABNORMAL LOW (ref 8.9–10.3)
Chloride: 99 mmol/L (ref 98–111)
Creatinine, Ser: 0.99 mg/dL (ref 0.44–1.00)
GFR, Estimated: 56 mL/min — ABNORMAL LOW (ref >60)
Glucose, Bld: 85 mg/dL (ref 70–99)
Potassium: 3.5 mmol/L (ref 3.5–5.1)
Sodium: 133 mmol/L — ABNORMAL LOW (ref 135–145)
Total Bilirubin: 0.9 mg/dL (ref 0.3–1.2)
Total Protein: 6.9 g/dL (ref 6.5–8.1)

## 2020-02-29 LAB — URINALYSIS, MICROSCOPIC (REFLEX)

## 2020-02-29 LAB — URINALYSIS, ROUTINE W REFLEX MICROSCOPIC
Bilirubin Urine: NEGATIVE
Glucose, UA: NEGATIVE mg/dL
Hgb urine dipstick: NEGATIVE
Ketones, ur: 40 mg/dL — AB
Nitrite: NEGATIVE
Protein, ur: 30 mg/dL — AB
Specific Gravity, Urine: 1.03 (ref 1.005–1.030)
pH: 6 (ref 5.0–8.0)

## 2020-02-29 MED ORDER — FAMOTIDINE IN NACL 20-0.9 MG/50ML-% IV SOLN: 20.0000 mg | Freq: Once | INTRAVENOUS | Status: DC | PRN

## 2020-02-29 MED ORDER — DIPHENHYDRAMINE HCL 50 MG/ML IJ SOLN: 50.0000 mg | Freq: Once | INTRAMUSCULAR | Status: DC | PRN

## 2020-02-29 MED ORDER — CEPHALEXIN 500 MG PO CAPS: 500.0000 mg | ORAL_CAPSULE | Freq: Two times a day (BID) | ORAL | 0 refills | Status: AC

## 2020-02-29 MED ORDER — ALBUTEROL SULFATE HFA 108 (90 BASE) MCG/ACT IN AERS: 2.0000 | INHALATION_SPRAY | Freq: Once | RESPIRATORY_TRACT | Status: DC | PRN

## 2020-02-29 MED ORDER — SODIUM CHLORIDE 0.9 % IV BOLUS
1000.0000 mL | Freq: Once | INTRAVENOUS | Status: AC
  Administered 2020-02-29: 15:00:00 1000 mL via INTRAVENOUS

## 2020-02-29 MED ORDER — SODIUM CHLORIDE 0.9 % IV SOLN
1200.0000 mg | Freq: Once | INTRAVENOUS | Status: AC
  Administered 2020-02-29: 16:00:00 1200 mg via INTRAVENOUS

## 2020-02-29 MED ORDER — EPINEPHRINE 0.3 MG/0.3ML IJ SOAJ: 0.3000 mg | Freq: Once | INTRAMUSCULAR | Status: DC | PRN

## 2020-02-29 MED ORDER — METHYLPREDNISOLONE SODIUM SUCC 125 MG IJ SOLR: 125.0000 mg | Freq: Once | INTRAMUSCULAR | Status: DC | PRN

## 2020-02-29 NOTE — Discharge Instructions (Signed)
You were evaluated in the emergency department today for your weakness.  You have a known COVID-19 infection.  Your physical exam and vital signs were very reassuring.  Your blood work, EKG, chest x-ray were also reassuring.   Your urine did show some signs of infection. You have been prescribed an antibiotic called cephalexin which you should take for the entirety of the prescription.   Additionally in the emergency department you were administered monoclonal antibody therapy, which can help prevent you from developing complications from your COVID-19 infection.  You tolerated them well.  Additionally administered IV fluids as you are mildly dehydrated.  You have been able to walk and interact much more comfortably since administration of fluids.   We feel you are safe to go home at this time, as you have family who will be staying with you.  Please follow-up with your primary care doctor next week for recheck of your urine and reevaluation of your weakness.  Please return to the emergency department develop any worsening weakness, chest pain, shortness of breath, palpitations, if you pass out, or if you develop any other new severe symptoms.

## 2020-02-29 NOTE — ED Notes (Signed)
Patient ambulated in room and she was very dizzy upon standing. No increased HR or decrease in SAT, but wobbly on feet. RN and PA aware

## 2020-02-29 NOTE — ED Notes (Signed)
Pt sonPOA Julie Strickland would like to be called. [phone]

## 2020-02-29 NOTE — ED Notes (Signed)
Son updated per pt request.  Awaiting results.

## 2020-02-29 NOTE — ED Triage Notes (Signed)
Diagnosed with covid last week. C/o gen weakness and fatigue. Denies SOB

## 2020-02-29 NOTE — ED Provider Notes (Signed)
[facility] EMERGENCY DEPARTMENT Provider Note   CSN: [id number] Arrival date & time: 02/29/20  0944     History Chief Complaint  Patient presents with  . Weakness    COVID+    Julie Strickland is a 86 y.o. female who presents with concern for extreme weakness and fatigue.  Patient with vaccine against COVID-19, however did not get her booster vaccination.  She began to experience myalgias on 12/15, presented to PCP on 12/16 and tested positive for COVID-19 at that time.  Patient endorses progressively worsening fatigue since then, states that this morning she woke up with severe weakness, "I could not even get myself out of bed, and I can usually do for myself just fine".  Patient denies chest pain, shortness of breath, palpitations, abdominal pain, nausea, vomiting, diarrhea, loss of taste or smell.  She denies dysuria but endorses urinary frequency, denies urgency or hematuria.    Collateral history obtained from her son, Julie Strickland, who endorses that his mother is normally ambulatory without issue, able to care for herself in her home.  States she has been getting progressively weaker over the past week, however has never had weakness like she experienced today.  I personally reviewed patient medical record of history of hypertension, diabetes mellitus, arrhythmia, sponge kidney, arthritis.  HPI     Past Medical History:  Diagnosis Date  . Arthritis   . Diabetes mellitus without complication (HCC)   . Hypertension   . Irregular heart beat   . Sponge kidney     There are no problems to display for this patient.   History reviewed. No pertinent surgical history.   OB History   No obstetric history on file.     No family history on file.  Social History   Tobacco Use  . Smoking status: Never Smoker  . Smokeless tobacco: Never Used  Substance Use Topics  . Alcohol use: No  . Drug use: No    Home Medications Prior to Admission medications    Medication Sig Start Date End Date Taking? Authorizing Provider  ATORVASTATIN CALCIUM PO Take by mouth.    [provider]  CARVEDILOL PO Take by mouth.    [provider]  cephALEXin (KEFLEX) 500 MG capsule Take 1 capsule (500 mg total) by mouth 2 (two) times daily for 7 days. 02/29/20 03/07/20  Genie Wenke, Lupe Carney R, PA-C  GABAPENTIN PO Take by mouth as needed.    [provider]  GLIMEPIRIDE PO Take by mouth.    [provider]  levofloxacin (LEVAQUIN) 750 MG tablet Take 1 tablet (750 mg total) by mouth daily. 09/09/16   Vanetta Mulders, MD  LISINOPRIL PO Take by mouth.    [provider]  METFORMIN HCL PO Take by mouth.    [provider]  methotrexate (RHEUMATREX) 2.5 MG tablet Take 2.5 mg by mouth once a week. Caution:Chemotherapy. Protect from light.    [provider]    Allergies    Patient has no known allergies.  Review of Systems   Review of Systems  Constitutional: Positive for activity change, appetite change, chills and fatigue. Negative for diaphoresis and fever.  HENT: Negative.   Respiratory: Negative for cough, chest tightness, shortness of breath and wheezing.   Cardiovascular: Negative for chest pain, palpitations and leg swelling.  Gastrointestinal: Negative for abdominal pain, nausea and vomiting.  Genitourinary: Positive for frequency. Negative for dysuria and urgency.  Musculoskeletal: Positive for myalgias.  Skin: Negative.   Neurological: Negative  for dizziness, facial asymmetry, speech difficulty, weakness, light-headedness and headaches.  Hematological: Negative.   Psychiatric/Behavioral: Negative.     Physical Exam Updated Vital Signs BP (!) 166/75   Pulse 82   Temp 99 F (37.2 C) (Oral)   Resp 17   Ht  (1.651 m)   Wt 63.5 kg   SpO2 95%   BMI 23.30 kg/m   Physical Exam Vitals and nursing note reviewed.  HENT:     Head: Normocephalic and atraumatic.     Right Ear: External  ear normal.     Left Ear: External ear normal.     Nose: Nose normal.     Mouth/Throat:     Mouth: Mucous membranes are dry.     Pharynx: Oropharynx is clear. Uvula midline. No oropharyngeal exudate or posterior oropharyngeal erythema.     Tonsils: No tonsillar exudate.  Eyes:     General:        Right eye: No discharge.        Left eye: No discharge.     Extraocular Movements: Extraocular movements intact.     Conjunctiva/sclera: Conjunctivae normal.     Pupils: Pupils are equal, round, and reactive to light.  Neck:     Trachea: Trachea and phonation normal.  Cardiovascular:     Rate and Rhythm: Normal rate and regular rhythm.     Pulses:          Radial pulses are 2+ on the right side and 2+ on the left side.       Dorsalis pedis pulses are 1+ on the right side and 1+ on the left side.     Heart sounds: Murmur heard.   Systolic murmur is present with a grade of 2/6.   Pulmonary:     Effort: Pulmonary effort is normal. No respiratory distress.     Breath sounds: Normal breath sounds. No wheezing or rales.  Chest:     Chest wall: No lacerations, deformity, swelling, tenderness, crepitus or edema.  Abdominal:     General: Bowel sounds are normal. There is no distension.     Tenderness: There is no abdominal tenderness. There is no right CVA tenderness, left CVA tenderness, guarding or rebound.  Musculoskeletal:        General: No deformity.     Cervical back: Neck supple. No rigidity or crepitus. No pain with movement or spinous process tenderness.     Right lower leg: No edema.     Left lower leg: No edema.  Lymphadenopathy:     Cervical: Cervical adenopathy present.     Left cervical: Superficial cervical adenopathy present.  Skin:    General: Skin is warm and dry.     Findings: No rash.  Neurological:     Mental Status: She is alert and oriented to person, place, and time. Mental status is at baseline.     Cranial Nerves: Cranial nerves are intact.     Sensory:  Sensation is intact.     Motor: Motor function is intact.  Psychiatric:        Mood and Affect: Mood normal.     ED Results / Procedures / Treatments   Labs (all labs ordered are listed, but only abnormal results are displayed) Labs Reviewed  COMPREHENSIVE METABOLIC PANEL - Abnormal; Notable for the following components:      Result Value   Sodium 133 (*)    Calcium 8.3 (*)    Albumin 3.2 (*)    GFR,  Estimated 56 (*)    All other components within normal limits  CBC WITH DIFFERENTIAL/PLATELET - Abnormal; Notable for the following components:   RBC 3.83 (*)    Hemoglobin 10.6 (*)    HCT 32.1 (*)    Neutro Abs 8.1 (*)    Monocytes Absolute 1.1 (*)    All other components within normal limits  URINALYSIS, ROUTINE W REFLEX MICROSCOPIC - Abnormal; Notable for the following components:   APPearance HAZY (*)    Ketones, ur 40 (*)    Protein, ur 30 (*)    Leukocytes,Ua SMALL (*)    All other components within normal limits  URINALYSIS, MICROSCOPIC (REFLEX) - Abnormal; Notable for the following components:   Bacteria, UA MANY (*)    All other components within normal limits  URINE CULTURE    EKG EKG Interpretation  Date/Time:  Thursday February 29 2020 11:09:30 EST Ventricular Rate:  86 PR Interval:    QRS Duration: 138 QT Interval:  397 QTC Calculation: 475 R Axis:   112 Text Interpretation: Sinus rhythm RBBB and LPFB Baseline wander in lead(s) V1 When compared to prior, less PVC. No STEMI Confirmed by Theda Belfast ([id number]) on 02/29/2020 1:25:12 PM   Radiology DG Chest Port 1 View  Result Date: 02/29/2020 CLINICAL DATA:  Weakness. EXAM: PORTABLE CHEST 1 VIEW COMPARISON:  May 19, 2018. FINDINGS: The heart size and mediastinal contours are within normal limits. Both lungs are clear. No visible pleural effusions or pneumothorax. No acute osseous abnormality. IMPRESSION: No active disease. Electronically Signed   By: Feliberto Harts MD   On: 02/29/2020 11:09     Procedures Procedures (including critical care time)  Medications Ordered in ED Medications  diphenhydrAMINE (BENADRYL) injection 50 mg (has no administration in time range)  famotidine (PEPCID) IVPB 20 mg premix (has no administration in time range)  methylPREDNISolone sodium succinate (SOLU-MEDROL) 125 mg/2 mL injection 125 mg (has no administration in time range)  albuterol (VENTOLIN HFA) 108 (90 Base) MCG/ACT inhaler 2 puff (has no administration in time range)  EPINEPHrine (EPI-PEN) injection 0.3 mg (has no administration in time range)  sodium chloride 0.9 % bolus 1,000 mL (0 mLs Intravenous Stopped 02/29/20 1747)  casirivimab-imdevimab (REGEN-COV) 1,200 mg in sodium chloride 0.9 % 110 mL IVPB (0 mg Intravenous Stopped 02/29/20 1644)    ED Course  I have reviewed the triage vital signs and the nursing notes.  Pertinent labs & imaging results that were available during my care of the patient were reviewed by me and considered in my medical decision making (see chart for details).    MDM Rules/Calculators/A&P                         86 year old female who presents with known COVID-19 infection concern for significant weakness has worsened in the past week.  Differential diagnosis for this patient's symptoms include but are limited to dehydration, UTI, COVID-19 related fatigue, hypoglycemia, metabolic derangement.   Borderline febrile on intake with temperature of 100.3, patient declined Tylenol. Vital signs otherwise normal on intake.  Physical exam with mildly dry mucous membranes. Soft cardiac systolic murmur. Patient appears extremely fatigued, weak, unwilling to participate significantly in her exam.  We will proceed with basic laboratory studies, chest x-ray, EKG at this time.  Chest x-ray negative for acute cardiopulmonary disease.  Orthostatic vital signs revealed significant orthostasis with transition from sitting to standing; BP dropped from 150s systolic to 112  systolic.  Heart rate  remained stable.  Attempted to ambulate patient with nursing staff, however patient was experiencing significant dizziness, unable to take more than a few steps even with significant support from nursing staff.  Was returned to bed due to concern for fall risk.  Patient administered a liter of fluids.  CBC with mild anemia with hemoglobin 10.6, previously 11.1.  CMP with mild hyponatremia to 133.  UA with protein, leukocytes, many bacteria.  Urine culture pending  EKG sinus rhythm, without change from prior, no STEMI.  Patient declined MAB in the outpatient setting; after extensive discussion in the emergency department today patient endorses desire to proceed with monoclonal antibody therapy while in the emergency department.  I feel this is reasonable given her age and comorbidities.  Patient received medical antibiotics and was observed for an hour and a half afterwards without complication.  At time of my reevaluation the patient she was able to ambulate independently to the bedside commode without difficulty.  She endorses desire to go home.  She states she is feeling much better after administration of fluids and no longer feeling as weak as she was at the time of her presentation to the ED.  She is much more interactive and talkative at this time as well.  Extensive discussion took place with the patient as well as with her son over the phone who is her power of attorney, regarding benefits of potential admission to the hospital for weakness in the context of known COVID-19 infection as well as urinary tract infection.  Shared decision making regarding disposition for admission versus discharge home with antibiotics for UTI and close PCP follow-up.  Discussed with patient's son over the phone who notified this provider that there will be an adult grandchild staying with Julie Strickland should she be discharged this evening.  Shared decision making with the patient who  expressed a clear desire to be discharged home at this time.  I feel this is reasonable plan.  Case discussed with attending physician, who saw the patient at the bedside.   Julie Strickland voiced understanding of her medical evaluation and treatment plan.  Each of her questions were answered to her expressed satisfaction.  No further work-up is warranted in the emergency department at this time.  Patient's vital signs have remained stable, recommend close PCP follow-up.  Strict return precautions were given.  Patient is stable and appropriate for discharge at this time.  Julie Strickland was evaluated in Emergency Department on 02/29/2020 for the symptoms described in the history of present illness. She was evaluated in the context of the global COVID-19 pandemic, which necessitated consideration that the patient might be at risk for infection with the SARS-CoV-2 virus that causes COVID-19. Institutional protocols and algorithms that pertain to the evaluation of patients at risk for COVID-19 are in a state of rapid change based on information released by regulatory bodies including the CDC and federal and state organizations. These policies and algorithms were followed during the patient's care in the ED.  Final Clinical Impression(s) / ED Diagnoses Final diagnoses:  Weakness  COVID-19  Acute cystitis with hematuria    Rx / DC Orders ED Discharge Orders         Ordered    cephALEXin (KEFLEX) 500 MG capsule  2 times daily        02/29/20 1839           Sherrilee Gilles 02/29/20 2010    Tegeler, Canary Brim, MD 03/04/20 [phone]

## 2020-02-29 NOTE — ED Notes (Signed)
Pt tolerating infusion without any untoward effects. VS WDL

## 2020-03-02 LAB — URINE CULTURE

## 2021-03-14 IMAGING — DX DG CHEST 1V PORT
1 series · 1 of 1 positions shown · non-contrast
Comparison: May 19, 2018.

CLINICAL DATA: Weakness.

EXAM:
PORTABLE CHEST 1 VIEW

[chest ap]
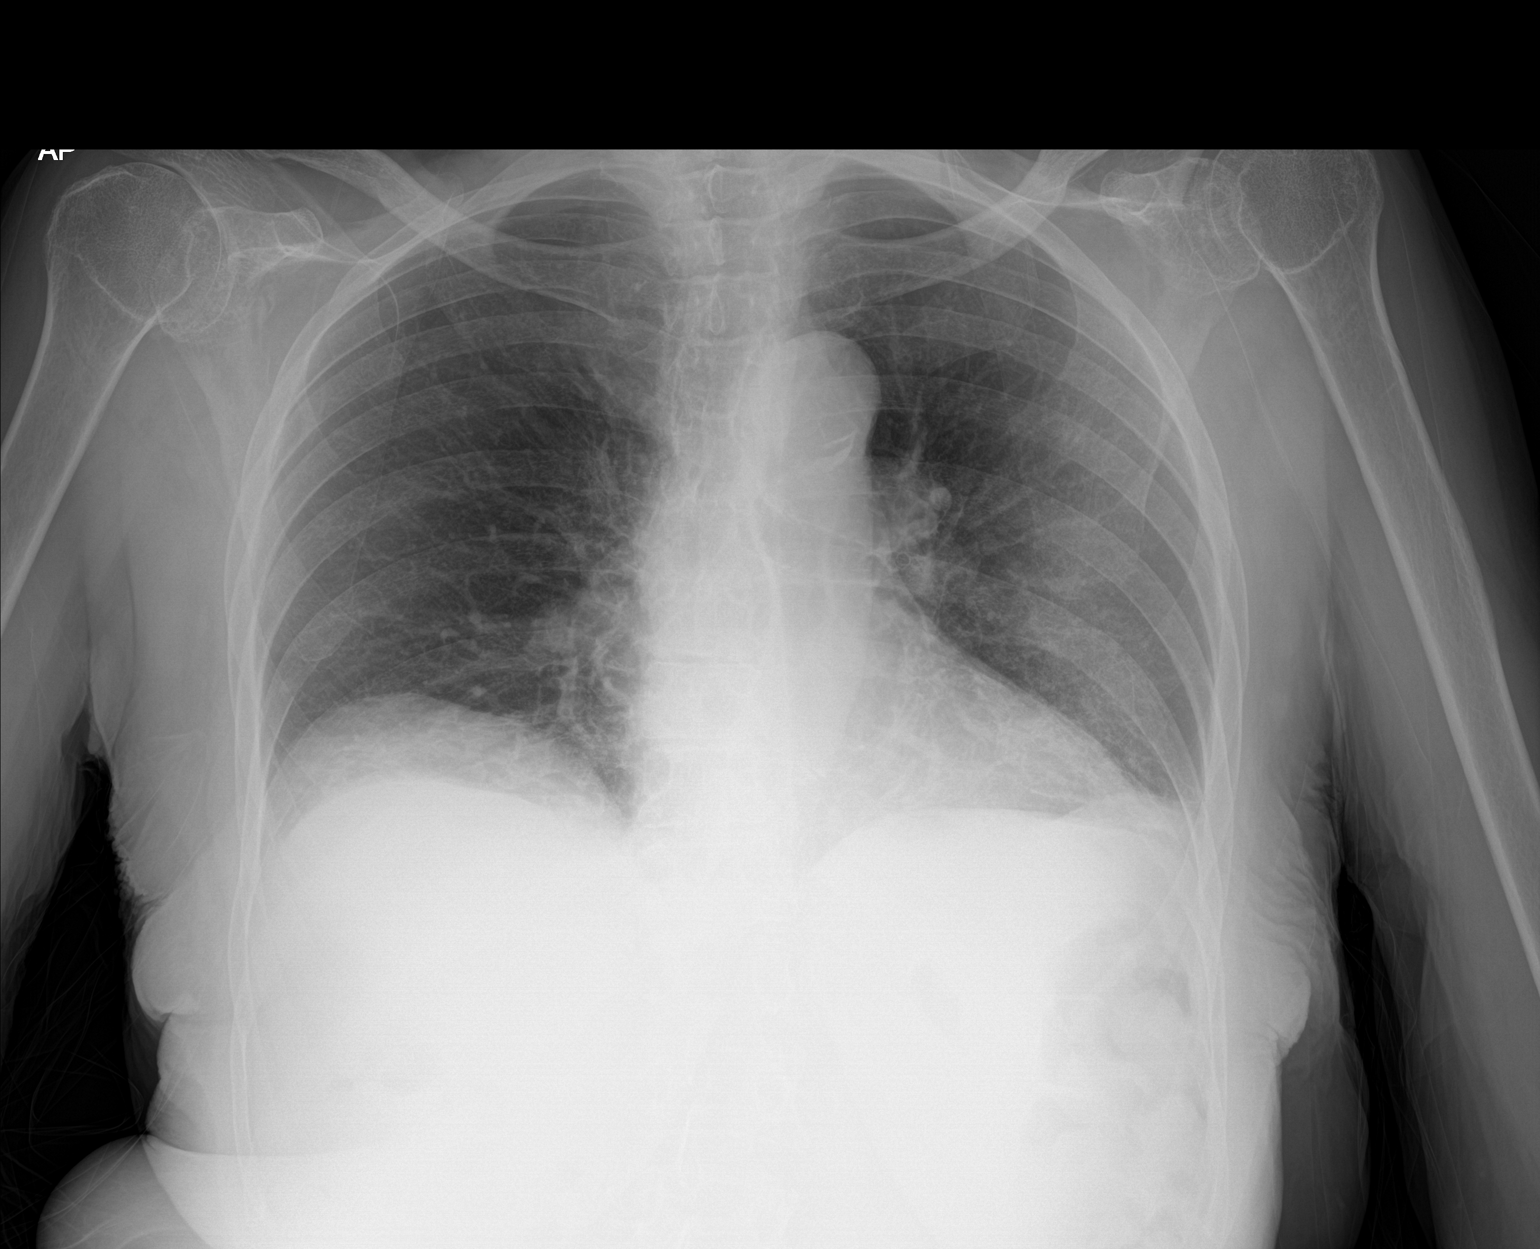

[1 of 1 positions shown; findings below may reference images not displayed]

FINDINGS: The heart size and mediastinal contours are within normal limits.
Both lungs are clear. No visible pleural effusions or pneumothorax.
No acute osseous abnormality.
IMPRESSION: No active disease.
